# Patient Record
Sex: Female | Born: 1975 | Race: White | Hispanic: No | Marital: Married | Smoking: Current every day smoker
Health system: Southern US, Community
[De-identification: ages and names within clinical notes are randomized; demographics above are authoritative.]

---

## 2001-11-17 ENCOUNTER — Encounter: Payer: Self-pay | Admitting: *Deleted

## 2001-11-17 ENCOUNTER — Emergency Department (HOSPITAL_COMMUNITY): Admission: EM | Admit: 2001-11-17 | Discharge: 2001-11-18 | Payer: Self-pay | Admitting: *Deleted

## 2005-12-05 ENCOUNTER — Emergency Department: Payer: Self-pay | Admitting: Emergency Medicine

## 2006-06-04 ENCOUNTER — Inpatient Hospital Stay: Payer: Self-pay | Admitting: Internal Medicine

## 2008-02-22 ENCOUNTER — Emergency Department: Payer: Self-pay | Admitting: Emergency Medicine

## 2009-12-09 ENCOUNTER — Emergency Department: Payer: Self-pay | Admitting: Emergency Medicine

## 2010-02-21 ENCOUNTER — Ambulatory Visit: Payer: Self-pay

## 2012-07-08 ENCOUNTER — Emergency Department: Payer: Self-pay | Admitting: Emergency Medicine

## 2012-07-08 LAB — URINALYSIS, COMPLETE
Bacteria: NONE SEEN
Bilirubin,UR: NEGATIVE
Glucose,UR: NEGATIVE mg/dL (ref 0–75)
Ketone: NEGATIVE
Leukocyte Esterase: NEGATIVE
Nitrite: NEGATIVE
Ph: 6 (ref 4.5–8.0)
Protein: NEGATIVE
RBC,UR: 3 /HPF (ref 0–5)
Specific Gravity: 1.008 (ref 1.003–1.030)
Squamous Epithelial: 2
WBC UR: <1 /HPF (ref 0–5)

## 2012-07-08 LAB — COMPREHENSIVE METABOLIC PANEL
Albumin: 4.1 g/dL (ref 3.4–5.0)
Alkaline Phosphatase: 70 U/L (ref 50–136)
Anion Gap: 7 (ref 7–16)
BUN: 6 mg/dL — ABNORMAL LOW (ref 7–18)
Bilirubin,Total: 0.3 mg/dL (ref 0.2–1.0)
Calcium, Total: 8.4 mg/dL — ABNORMAL LOW (ref 8.5–10.1)
Chloride: 107 mmol/L (ref 98–107)
Co2: 26 mmol/L (ref 21–32)
Creatinine: 0.67 mg/dL (ref 0.60–1.30)
EGFR (African American): >60
EGFR (Non-African Amer.): >60
Glucose: 94 mg/dL (ref 65–99)
Osmolality: 277 (ref 275–301)
Potassium: 3.5 mmol/L (ref 3.5–5.1)
SGOT(AST): 11 U/L — ABNORMAL LOW (ref 15–37)
SGPT (ALT): 17 U/L (ref 12–78)
Sodium: 140 mmol/L (ref 136–145)
Total Protein: 7.5 g/dL (ref 6.4–8.2)

## 2012-07-08 LAB — LIPASE, BLOOD: Lipase: 92 U/L (ref 73–393)

## 2012-07-08 LAB — CBC
HCT: 39.8 % (ref 35.0–47.0)
HGB: 14.4 g/dL (ref 12.0–16.0)
MCH: 33.5 pg (ref 26.0–34.0)
MCHC: 36.2 g/dL — ABNORMAL HIGH (ref 32.0–36.0)
MCV: 92 fL (ref 80–100)
Platelet: 308 10*3/uL (ref 150–440)
RBC: 4.31 10*6/uL (ref 3.80–5.20)
RDW: 12.9 % (ref 11.5–14.5)
WBC: 8.1 10*3/uL (ref 3.6–11.0)

## 2012-07-08 LAB — HCG, QUANTITATIVE, PREGNANCY: Beta Hcg, Quant.: <1 m[IU]/mL — ABNORMAL LOW

## 2012-07-08 LAB — PREGNANCY, URINE: Pregnancy Test, Urine: NEGATIVE m[IU]/mL

## 2012-08-01 ENCOUNTER — Emergency Department: Payer: Self-pay | Admitting: Emergency Medicine

## 2019-08-14 ENCOUNTER — Emergency Department: Payer: Self-pay

## 2019-08-14 ENCOUNTER — Emergency Department
Admission: EM | Admit: 2019-08-14 | Discharge: 2019-08-14 | Disposition: A | Discharge status: Home / Self Care | Payer: Self-pay | Attending: Emergency Medicine | Admitting: Emergency Medicine

## 2019-08-14 ENCOUNTER — Encounter: Payer: Self-pay | Admitting: Medical Oncology

## 2019-08-14 ENCOUNTER — Other Ambulatory Visit: Payer: Self-pay

## 2019-08-14 DIAGNOSIS — Y9389 Activity, other specified: Secondary | ICD-10-CM | POA: Insufficient documentation

## 2019-08-14 DIAGNOSIS — Y929 Unspecified place or not applicable: Secondary | ICD-10-CM | POA: Insufficient documentation

## 2019-08-14 DIAGNOSIS — M5412 Radiculopathy, cervical region: Secondary | ICD-10-CM | POA: Insufficient documentation

## 2019-08-14 DIAGNOSIS — X58XXXA Exposure to other specified factors, initial encounter: Secondary | ICD-10-CM | POA: Insufficient documentation

## 2019-08-14 DIAGNOSIS — S161XXA Strain of muscle, fascia and tendon at neck level, initial encounter: Secondary | ICD-10-CM | POA: Insufficient documentation

## 2019-08-14 DIAGNOSIS — R519 Headache, unspecified: Secondary | ICD-10-CM | POA: Insufficient documentation

## 2019-08-14 DIAGNOSIS — Y999 Unspecified external cause status: Secondary | ICD-10-CM | POA: Insufficient documentation

## 2019-08-14 MED ORDER — LIDOCAINE 5 % EX PTCH
1.0000 | MEDICATED_PATCH | CUTANEOUS | Status: DC
  Administered 2019-08-14: 1 via TRANSDERMAL
  Filled 2019-08-14: qty 1

## 2019-08-14 MED ORDER — TRAMADOL HCL 50 MG PO TABS: 50.0000 mg | ORAL_TABLET | Freq: Four times a day (QID) | ORAL | 0 refills | Status: AC | PRN

## 2019-08-14 MED ORDER — TRAMADOL HCL 50 MG PO TABS
50.0000 mg | ORAL_TABLET | Freq: Once | ORAL | Status: AC
  Administered 2019-08-14: 50 mg via ORAL
  Filled 2019-08-14: qty 1

## 2019-08-14 MED ORDER — CYCLOBENZAPRINE HCL 10 MG PO TABS
10.0000 mg | ORAL_TABLET | Freq: Once | ORAL | Status: AC
  Administered 2019-08-14: 10 mg via ORAL
  Filled 2019-08-14: qty 1

## 2019-08-14 MED ORDER — CYCLOBENZAPRINE HCL 10 MG PO TABS: 10.0000 mg | ORAL_TABLET | Freq: Three times a day (TID) | ORAL | 0 refills | Status: DC | PRN

## 2019-08-14 NOTE — Discharge Instructions (Addendum)
Follow discharge care instruction take medication as directed. °
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
## 2019-08-14 NOTE — ED Triage Notes (Signed)
Pt reports sharp neck pains that radiate to head that began Tuesday. Pain has worsened since yesterday morning. Pain worsens with movement.

## 2019-10-03 ENCOUNTER — Other Ambulatory Visit: Payer: Self-pay

## 2019-10-03 ENCOUNTER — Emergency Department
Admission: EM | Admit: 2019-10-03 | Discharge: 2019-10-03 | Disposition: A | Discharge status: Home / Self Care | Payer: No Typology Code available for payment source | Attending: Emergency Medicine | Admitting: Emergency Medicine

## 2019-10-03 ENCOUNTER — Emergency Department: Payer: No Typology Code available for payment source

## 2019-10-03 DIAGNOSIS — M791 Myalgia, unspecified site: Secondary | ICD-10-CM | POA: Insufficient documentation

## 2019-10-03 DIAGNOSIS — Y999 Unspecified external cause status: Secondary | ICD-10-CM | POA: Insufficient documentation

## 2019-10-03 DIAGNOSIS — Y92410 Unspecified street and highway as the place of occurrence of the external cause: Secondary | ICD-10-CM | POA: Diagnosis not present

## 2019-10-03 DIAGNOSIS — S80212A Abrasion, left knee, initial encounter: Secondary | ICD-10-CM | POA: Diagnosis not present

## 2019-10-03 DIAGNOSIS — T07XXXA Unspecified multiple injuries, initial encounter: Secondary | ICD-10-CM

## 2019-10-03 DIAGNOSIS — Y939 Activity, unspecified: Secondary | ICD-10-CM | POA: Diagnosis not present

## 2019-10-03 DIAGNOSIS — F17200 Nicotine dependence, unspecified, uncomplicated: Secondary | ICD-10-CM | POA: Diagnosis not present

## 2019-10-03 DIAGNOSIS — M25532 Pain in left wrist: Secondary | ICD-10-CM | POA: Insufficient documentation

## 2019-10-03 DIAGNOSIS — M7918 Myalgia, other site: Secondary | ICD-10-CM

## 2019-10-03 DIAGNOSIS — S60512A Abrasion of left hand, initial encounter: Secondary | ICD-10-CM | POA: Diagnosis not present

## 2019-10-03 DIAGNOSIS — S80211A Abrasion, right knee, initial encounter: Secondary | ICD-10-CM | POA: Diagnosis not present

## 2019-10-03 DIAGNOSIS — S66002A Unspecified injury of long flexor muscle, fascia and tendon of left thumb at wrist and hand level, initial encounter: Secondary | ICD-10-CM | POA: Diagnosis present

## 2019-10-03 MED ORDER — HYDROCODONE-ACETAMINOPHEN 5-325 MG PO TABS: 1.0000 | ORAL_TABLET | Freq: Three times a day (TID) | ORAL | 0 refills | Status: AC | PRN

## 2019-10-03 MED ORDER — CYCLOBENZAPRINE HCL 5 MG PO TABS: 5.0000 mg | ORAL_TABLET | Freq: Three times a day (TID) | ORAL | 0 refills | Status: AC | PRN

## 2019-10-03 MED ORDER — BACITRACIN-NEOMYCIN-POLYMYXIN 400-5-5000 EX OINT
TOPICAL_OINTMENT | Freq: Once | CUTANEOUS | Status: AC
  Administered 2019-10-03: 3 via TOPICAL
  Filled 2019-10-03: qty 3

## 2019-10-03 MED ORDER — CYCLOBENZAPRINE HCL 10 MG PO TABS
10.0000 mg | ORAL_TABLET | Freq: Once | ORAL | Status: AC
  Administered 2019-10-03: 20:00:00 10 mg via ORAL
  Filled 2019-10-03: qty 1

## 2019-10-03 MED ORDER — HYDROCODONE-ACETAMINOPHEN 5-325 MG PO TABS
1.0000 | ORAL_TABLET | Freq: Once | ORAL | Status: AC
  Administered 2019-10-03: 1 via ORAL
  Filled 2019-10-03: qty 1
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
## 2019-10-03 NOTE — ED Notes (Signed)
Pt ambulated independently to the restroom without difficulty

## 2020-03-05 ENCOUNTER — Telehealth: Payer: Self-pay | Admitting: General Practice

## 2020-03-05 NOTE — Telephone Encounter (Signed)
Individual has been contacted 3+ times regarding ED referral. No further attempts to contact individual will be made. 
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
